# Patient Record
Sex: Female | Born: 1990 | Race: White | Hispanic: No | Marital: Single | Smoking: Never smoker
Health system: Southern US, Community
[De-identification: ages and names within clinical notes are randomized; demographics above are authoritative.]

## PROBLEM LIST (undated history)

## (undated) DIAGNOSIS — F419 Anxiety disorder, unspecified: Secondary | ICD-10-CM

---
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
## 2016-02-05 DIAGNOSIS — F418 Other specified anxiety disorders: Secondary | ICD-10-CM | POA: Insufficient documentation

## 2016-02-18 ENCOUNTER — Emergency Department (HOSPITAL_BASED_OUTPATIENT_CLINIC_OR_DEPARTMENT_OTHER)
Admission: EM | Admit: 2016-02-18 | Discharge: 2016-02-18 | Disposition: A | Discharge status: Home / Self Care | Payer: BLUE CROSS/BLUE SHIELD | Attending: Emergency Medicine | Admitting: Emergency Medicine

## 2016-02-18 ENCOUNTER — Encounter (HOSPITAL_BASED_OUTPATIENT_CLINIC_OR_DEPARTMENT_OTHER): Payer: Self-pay | Admitting: *Deleted

## 2016-02-18 DIAGNOSIS — N3001 Acute cystitis with hematuria: Secondary | ICD-10-CM | POA: Diagnosis not present

## 2016-02-18 DIAGNOSIS — R319 Hematuria, unspecified: Secondary | ICD-10-CM | POA: Diagnosis present

## 2016-02-18 DIAGNOSIS — Z79899 Other long term (current) drug therapy: Secondary | ICD-10-CM | POA: Diagnosis not present

## 2016-02-18 HISTORY — DX: Anxiety disorder, unspecified: F41.9

## 2016-02-18 LAB — URINE MICROSCOPIC-ADD ON

## 2016-02-18 LAB — URINALYSIS, ROUTINE W REFLEX MICROSCOPIC
Bilirubin Urine: NEGATIVE
GLUCOSE, UA: NEGATIVE mg/dL
KETONES UR: 15 mg/dL — AB
NITRITE: POSITIVE — AB
PH: 5.5 (ref 5.0–8.0)
Protein, ur: 30 mg/dL — AB
SPECIFIC GRAVITY, URINE: 1.004 — AB (ref 1.005–1.030)

## 2016-02-18 LAB — PREGNANCY, URINE: Preg Test, Ur: NEGATIVE

## 2016-02-18 MED ORDER — PHENAZOPYRIDINE HCL 200 MG PO TABS: 200.0000 mg | ORAL_TABLET | Freq: Three times a day (TID) | ORAL | Status: DC

## 2016-02-18 MED ORDER — CEPHALEXIN 500 MG PO CAPS: 500.0000 mg | ORAL_CAPSULE | Freq: Three times a day (TID) | ORAL | Status: AC

## 2016-02-18 NOTE — ED Provider Notes (Signed)
CSN: [id number]     Arrival date & time 02/18/16  4098 History   First MD Initiated Contact with Patient 02/18/16 [phone]     Chief Complaint  Patient presents with  . Hematuria     (Consider location/radiation/quality/duration/timing/severity/associated sxs/prior Treatment) Patient is a 25 y.o. female presenting with hematuria and dysuria.  Hematuria This is a new problem. Episode onset: 3Am. The problem occurs constantly. Associated symptoms include abdominal pain (suprapubic). Pertinent negatives include no chest pain, no headaches and no shortness of breath.  Dysuria Duration:  2 days Progression:  Unchanged Chronicity:  New Urinary symptoms: frequent urination, hematuria and hesitancy   Associated symptoms: abdominal pain (suprapubic)   Associated symptoms: no fever and no vomiting  Nausea: with urgency to urinate.     Past Medical History  Diagnosis Date  . Anxiety    Past Surgical History  Procedure Laterality Date  . Breast reduction surgery  2008   No family history on file. Social History  Substance Use Topics  . Smoking status: Never Smoker   . Smokeless tobacco: Not on file  . Alcohol Use: Not on file     Comment: social   OB History    No data available     Review of Systems  Constitutional: Negative for fever.  HENT: Negative for sore throat.   Eyes: Negative for visual disturbance.  Respiratory: Negative for cough and shortness of breath.   Cardiovascular: Negative for chest pain.  Gastrointestinal: Positive for abdominal pain (suprapubic). Negative for vomiting. Nausea: with urgency to urinate.  Genitourinary: Positive for dysuria and hematuria. Negative for difficulty urinating.  Musculoskeletal: Negative for back pain and neck pain.  Skin: Negative for rash.  Neurological: Negative for syncope and headaches. Light-headedness: up since 2am and feeling a bit lightheaded.      Allergies  Review of patient's allergies indicates no known  allergies.  Home Medications   Prior to Admission medications   Medication Sig Start Date End Date Taking? Authorizing Provider  citalopram (CELEXA) 20 MG tablet Take 20 mg by mouth daily.   Yes Historical Provider, MD  cephALEXin (KEFLEX) 500 MG capsule Take 1 capsule (500 mg total) by mouth 3 (three) times daily. 02/18/16 02/25/16  Alvira Monday, MD  phenazopyridine (PYRIDIUM) 200 MG tablet Take 1 tablet (200 mg total) by mouth 3 (three) times daily. 02/18/16   Alvira Monday, MD   BP 122/73 mmHg  Pulse 78  Temp(Src) 98.7 F (37.1 C) (Oral)  Resp 16  Ht  (1.727 m)  Wt 185 lb (83.915 kg)  BMI 28.14 kg/m2  SpO2 100% Physical Exam  Constitutional: She is oriented to person, place, and time. She appears well-developed and well-nourished. No distress.  HENT:  Head: Normocephalic and atraumatic.  Eyes: Conjunctivae and EOM are normal.  Neck: Normal range of motion.  Cardiovascular: Normal rate, regular rhythm, normal heart sounds and intact distal pulses.  Exam reveals no gallop and no friction rub.   No murmur heard. Pulmonary/Chest: Effort normal and breath sounds normal. No respiratory distress. She has no wheezes. She has no rales.  Abdominal: Soft. She exhibits no distension. There is tenderness (suprapubic). There is no guarding.  No CVA tenderness  Musculoskeletal: She exhibits no edema or tenderness.  Neurological: She is alert and oriented to person, place, and time.  Skin: Skin is warm and dry. No rash noted. She is not diaphoretic. No erythema.  Nursing note and vitals reviewed.   ED Course  Procedures (including critical care  time) Labs Review Labs Reviewed  URINALYSIS, ROUTINE W REFLEX MICROSCOPIC (NOT AT [facility]) - Abnormal; Notable for the following:    Color, Urine AMBER (*)    APPearance CLOUDY (*)    Specific Gravity, Urine 1.004 (*)    Hgb urine dipstick LARGE (*)    Ketones, ur 15 (*)    Protein, ur 30 (*)    Nitrite POSITIVE (*)    Leukocytes, UA  LARGE (*)    All other components within normal limits  URINE MICROSCOPIC-ADD ON - Abnormal; Notable for the following:    Squamous Epithelial / LPF 0-5 (*)    Bacteria, UA MANY (*)    All other components within normal limits  PREGNANCY, URINE    Imaging Review No results found. I have personally reviewed and evaluated these images and lab results as part of my medical decision-making.   EKG Interpretation None      MDM   Final diagnoses:  Acute cystitis with hematuria   25 year old female with no significant medical history presents with concern for hesitancy, urgency and frequency for 2 days, and hematuria beginning last night. Urinalysis consistent with urinary tract infection. Patient does not have any flank pain, fever or vomiting and have low suspicion for pyelonephritis. Her history is not consistent with nephrolithiasis. Given a prescription for Pyridium and Keflex for 1 week for hemorrhagic cystitis. Discussed reasons to return in detail. Patient discharged in stable condition with understanding of reasons to return.   Alvira MondayErin Aimee Heldman, MD 02/18/16 [phone]

## 2016-02-18 NOTE — ED Notes (Signed)
States awoke yesterday having urinary frequency and urgency, has been drinking a lot of water, early this am, return of S/S occurred, approx 0300hrs noted began having blood urine, and began having lower abd pain.

## 2016-07-02 DIAGNOSIS — F410 Panic disorder [episodic paroxysmal anxiety] without agoraphobia: Secondary | ICD-10-CM | POA: Insufficient documentation

## 2017-01-07 DIAGNOSIS — F5104 Psychophysiologic insomnia: Secondary | ICD-10-CM | POA: Insufficient documentation

## 2017-05-12 DIAGNOSIS — E669 Obesity, unspecified: Secondary | ICD-10-CM | POA: Insufficient documentation

## 2017-06-11 DIAGNOSIS — G43101 Migraine with aura, not intractable, with status migrainosus: Secondary | ICD-10-CM | POA: Insufficient documentation

## 2017-06-20 DIAGNOSIS — Z3042 Encounter for surveillance of injectable contraceptive: Secondary | ICD-10-CM | POA: Insufficient documentation

## 2017-10-06 ENCOUNTER — Other Ambulatory Visit: Payer: Self-pay | Admitting: Nurse Practitioner

## 2017-10-06 DIAGNOSIS — R140 Abdominal distension (gaseous): Secondary | ICD-10-CM

## 2017-10-06 DIAGNOSIS — R112 Nausea with vomiting, unspecified: Secondary | ICD-10-CM

## 2017-10-06 DIAGNOSIS — K30 Functional dyspepsia: Secondary | ICD-10-CM

## 2017-10-08 ENCOUNTER — Other Ambulatory Visit: Payer: Self-pay

## 2017-10-08 DIAGNOSIS — K582 Mixed irritable bowel syndrome: Secondary | ICD-10-CM | POA: Insufficient documentation

## 2017-10-09 ENCOUNTER — Ambulatory Visit
Admission: RE | Admit: 2017-10-09 | Discharge: 2017-10-09 | Disposition: A | Discharge status: Home / Self Care | Payer: BLUE CROSS/BLUE SHIELD | Source: Ambulatory Visit | Attending: Nurse Practitioner | Admitting: Nurse Practitioner

## 2017-10-09 DIAGNOSIS — R112 Nausea with vomiting, unspecified: Secondary | ICD-10-CM | POA: Diagnosis present

## 2017-10-09 DIAGNOSIS — K30 Functional dyspepsia: Secondary | ICD-10-CM | POA: Diagnosis present

## 2017-10-09 DIAGNOSIS — R140 Abdominal distension (gaseous): Secondary | ICD-10-CM | POA: Diagnosis present

## 2017-10-31 ENCOUNTER — Ambulatory Visit: Payer: Self-pay

## 2017-11-06 ENCOUNTER — Ambulatory Visit: Payer: Self-pay

## 2018-03-23 ENCOUNTER — Other Ambulatory Visit: Payer: Self-pay | Admitting: Nurse Practitioner

## 2018-03-23 DIAGNOSIS — R140 Abdominal distension (gaseous): Secondary | ICD-10-CM

## 2018-03-23 DIAGNOSIS — R1084 Generalized abdominal pain: Secondary | ICD-10-CM

## 2018-04-01 ENCOUNTER — Ambulatory Visit
Admission: RE | Admit: 2018-04-01 | Discharge: 2018-04-01 | Disposition: A | Discharge status: Home / Self Care | Payer: BLUE CROSS/BLUE SHIELD | Source: Ambulatory Visit | Attending: Nurse Practitioner | Admitting: Nurse Practitioner

## 2018-04-01 DIAGNOSIS — R140 Abdominal distension (gaseous): Secondary | ICD-10-CM | POA: Diagnosis present

## 2018-04-01 DIAGNOSIS — R1084 Generalized abdominal pain: Secondary | ICD-10-CM | POA: Insufficient documentation

## 2018-04-01 MED ORDER — IOHEXOL 300 MG/ML  SOLN
100.0000 mL | Freq: Once | INTRAMUSCULAR | Status: AC | PRN
  Administered 2018-04-01: 100 mL via INTRAVENOUS

## 2018-12-03 ENCOUNTER — Encounter: Payer: Self-pay | Admitting: *Deleted

## 2018-12-24 ENCOUNTER — Encounter: Payer: Self-pay | Admitting: Gastroenterology

## 2018-12-24 ENCOUNTER — Other Ambulatory Visit: Payer: Self-pay

## 2018-12-24 ENCOUNTER — Ambulatory Visit: Payer: BLUE CROSS/BLUE SHIELD | Admitting: Gastroenterology

## 2018-12-24 VITALS — BP 125/77 | HR 76 | Ht 68.0 in | Wt 207.6 lb

## 2018-12-24 DIAGNOSIS — K582 Mixed irritable bowel syndrome: Secondary | ICD-10-CM

## 2018-12-24 DIAGNOSIS — R140 Abdominal distension (gaseous): Secondary | ICD-10-CM

## 2018-12-24 MED ORDER — PANTOPRAZOLE SODIUM 40 MG PO TBEC: 40.0000 mg | DELAYED_RELEASE_TABLET | Freq: Every day | ORAL | 6 refills | Status: AC

## 2018-12-24 MED ORDER — DICYCLOMINE HCL 20 MG PO TABS: 20.0000 mg | ORAL_TABLET | Freq: Three times a day (TID) | ORAL | 3 refills | Status: DC

## 2018-12-24 NOTE — Progress Notes (Signed)
Gastroenterology Consultation  Referring Provider:     Leotis Shames, MD Primary Care Physician:  Leotis Shames, MD Primary Gastroenterologist:  Dr. Servando Snare     Reason for Consultation:     Abdominal pain bloating Constipation and diarrhea        HPI:   Kelly Miller is a 28 y.o. y/o female referred for consultation & management of Abdominal pain bloating diarrhea and constipation by Dr. Leotis Shames, MD.  This patient comes in today with a report of being very stressed.  She states that she has a very stressful life and her symptoms started about 3 years ago.  The patient reports her symptoms to include intolerance to multiple foods.  She states that she has a lot diarrhea with bloating and abdominal cramps.  She also states that the bloating sometimes makes her stomach look like she is many months pregnant.  The patient had a CT scan and ultrasound when she was seen by Dr. Earnest Conroy PA and by Dr. Mechele Collin.  The patient also reports that she had an upper endoscopy for which I do not have a report but she states that she was told she had gastritis.  She was also told that she was be started on a medication but never was.  Her main concern is her nausea with chronic abdominal pain.  The patient will have alternating diarrhea and constipation.  There is no report of any unexplained weight loss associated with these symptoms. She was very unsatisfied with the workup and does not feel like her previous gastroenterologist office was listening to her.  The patient has spoken to her mother and a was encouraged by her to have a colonoscopy.  She also states that she was recommended to have a colonoscopy by the previous GI team evaluating her. The patient states that her food intolerance includes milk products, beef, anything with garlic or onion powder.  She says that whenever these foods are in her diet she starts to vomit immediately to expel food.  She states that she is now on a strict gluten-free  the diet.  She says she is been on a gluten-free diet for some time but continues to have symptoms.  She had a previous celiac tests done that did not show her to have serology consistent with celiac sprue.  Past Medical History:  Diagnosis Date  . Anxiety       Prior to Admission medications   Medication Sig Start Date End Date Taking? Authorizing Provider  ALPRAZolam Prudy Feeler) 1 MG tablet alprazolam 1 mg tablet  Take 1 tablet 3 times a day by oral route.   Yes [provider]  medroxyPROGESTERone Acetate 150 MG/ML SUSY medroxyprogesterone 150 mg/mL intramuscular syringe  INJECT 150 MG EVERY 3 MONTHS.   Yes [provider]  omeprazole (PRILOSEC) 40 MG capsule omeprazole 40 mg capsule,delayed release  TAKE ONE CAPSULE BY MOUTH EVERY DAY 10/08/18  Yes [provider]  topiramate (TOPAMAX) 50 MG tablet Take 50 mg by mouth 2 (two) times daily. 11/23/18  Yes [provider]  venlafaxine XR (EFFEXOR-XR) 150 MG 24 hr capsule Take by mouth daily. 11/23/18  Yes [provider]  zolpidem (AMBIEN) 10 MG tablet TAKE 1/2 TABLET BY MOUTH AT BEDTIME AS NEEDED 10/23/16  Yes [provider]    History reviewed. No pertinent family history.   Social History   Tobacco Use  . Smoking status: Never Smoker  . Smokeless tobacco: Never Used  Substance Use Topics  .  Alcohol use: Yes    Comment: social  . Drug use: No    Allergies as of 12/24/2018  . (No Known Allergies)    Review of Systems:    All systems reviewed and negative except where noted in HPI.   Physical Exam:  BP 125/77   Pulse 76   Ht 5\' 8"  (1.727 m)   Wt 207 lb 9.6 oz (94.2 kg)   BMI 31.57 kg/m  No LMP recorded. Patient has had an injection. General:   Alert,  Well-developed, well-nourished, pleasant and cooperative in NAD Head:  Normocephalic and atraumatic. Eyes:  Sclera clear, no icterus.   Conjunctiva pink. Ears:  Normal auditory acuity. Nose:  No deformity, discharge, or  lesions. Mouth:  No deformity or lesions,oropharynx pink & moist. Neck:  Supple; no masses or thyromegaly. Lungs:  Respirations even and unlabored.  Clear throughout to auscultation.   No wheezes, crackles, or rhonchi. No acute distress. Heart:  Regular rate and rhythm; no murmurs, clicks, rubs, or gallops. Abdomen:  Normal bowel sounds.  No bruits.  Soft, abdominal pain mostly in the right lower quadrant and non-distended without masses, hepatosplenomegaly or hernias noted.  No guarding or rebound tenderness.  Negative Carnett sign.   Rectal:  Deferred.  Msk:  Symmetrical without gross deformities.  Good, equal movement & strength bilaterally. Pulses:  Normal pulses noted. Extremities:  No clubbing or edema.  No cyanosis. Neurologic:  Alert and oriented x3;  grossly normal neurologically. Skin:  Intact without significant lesions or rashes.  No jaundice. Lymph Nodes:  No significant cervical adenopathy. Psych:  Alert and cooperative. Normal mood and affect.  Imaging Studies: No results found.  Assessment and Plan:   Kelly Miller is a 28 y.o. 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 When questioned more about this she states that she is not bothered by Lactaid milk.  The patient has been told that she is likely lactose intolerant and that's why she is doing well with Lactaid milk and has been encouraged to continue that.  The patient also has been told that with her alternating diarrhea and constipation and bloating she likely has irritable bowel syndrome.  The patient has been tested for H. Pylori the past and it has been negative.  She will be treated as irritable bowel syndrome with alternating diarrhea and constipation and has been told of this diagnosis.  She will also be started on dicyclomine 20 mg 3 times a day.  Due to the question of whether she has celiac sprue or not she will have her blood checked for a celiac sprue panel  and DQ 2 and DQ 8.  She will also have her blood sends off for out for Alpha-gal. With her symptoms of nausea and history of gastritis she will also be started on pantoprazole 40 mg once a day and for her abdominal cramps will be started on dicyclomine 20 mg 3 times a day.  The patient has been told that these may help the symptoms but do not cure her irritable bowel syndrome.  The patient will be contacted with the results of the blood work. She has been explained the plan and states she agrees with it.  Midge Minium, MD. Clementeen Graham    Note: This dictation was prepared with Dragon dictation along with smaller phrase technology. Any transcriptional errors that result from this process are unintentional.

## 2018-12-25 ENCOUNTER — Encounter: Payer: Self-pay | Admitting: Unknown Physician Specialty

## 2019-01-07 LAB — CELIAC DISEASE HLA DQ ASSOC.
DQ2 (DQA1 0501/0505,DQB1 02XX): NEGATIVE
DQ8 (DQA1 03XX, DQB1 0302): NEGATIVE

## 2019-01-07 LAB — ALPHA-GAL PANEL
Alpha Gal IgE*: <0.1 kU/L (ref <0.10)
BEEF CLASS INTERPRETATION: 0
Class Interpretation: 0
Class Interpretation: 0
Lamb/Mutton (Ovis spp) IgE: <0.1 kU/L (ref <0.35)

## 2019-01-07 LAB — CELIAC DISEASE PANEL
Endomysial IgA: NEGATIVE
IgA/Immunoglobulin A, Serum: 118 mg/dL (ref 87–352)
Transglutaminase IgA: <2 U/mL (ref 0–3)

## 2019-01-08 ENCOUNTER — Telehealth: Payer: Self-pay

## 2019-01-08 NOTE — Telephone Encounter (Signed)
-----   Message from Midge Minium, MD sent at 01/07/2019  2:11 PM EDT ----- Let the patient know that her celiac sprue panel and alpha gal were both negative.  We are still waiting for the DQ 2 and DQ 8 to come back.

## 2019-01-08 NOTE — Telephone Encounter (Signed)
Pt notified of lab results

## 2019-01-08 NOTE — Telephone Encounter (Signed)
-----   Message from Midge Minium, MD sent at 01/07/2019  7:36 PM EDT ----- That the patient know that her celiac sprue genotype was negative for carrying any the genes that would makeher susceptible to Celexa group.  With this genotype she has less than a 0.04% chance of having celiac sprue.

## 2019-03-19 ENCOUNTER — Other Ambulatory Visit: Payer: Self-pay | Admitting: Gastroenterology

## 2019-05-12 ENCOUNTER — Other Ambulatory Visit (HOSPITAL_COMMUNITY): Payer: Self-pay | Admitting: Student

## 2019-05-12 ENCOUNTER — Other Ambulatory Visit: Payer: Self-pay | Admitting: Student

## 2019-05-12 DIAGNOSIS — R112 Nausea with vomiting, unspecified: Secondary | ICD-10-CM

## 2019-05-12 DIAGNOSIS — R140 Abdominal distension (gaseous): Secondary | ICD-10-CM

## 2019-05-12 DIAGNOSIS — R6881 Early satiety: Secondary | ICD-10-CM

## 2019-05-12 DIAGNOSIS — R630 Anorexia: Secondary | ICD-10-CM

## 2019-05-17 ENCOUNTER — Ambulatory Visit: Payer: BLUE CROSS/BLUE SHIELD

## 2019-05-21 ENCOUNTER — Ambulatory Visit
Admission: RE | Admit: 2019-05-21 | Discharge: 2019-05-21 | Disposition: A | Discharge status: Home / Self Care | Payer: BC Managed Care – PPO | Source: Ambulatory Visit | Attending: Student | Admitting: Student

## 2019-05-21 ENCOUNTER — Other Ambulatory Visit
Admission: RE | Admit: 2019-05-21 | Discharge: 2019-05-21 | Disposition: A | Discharge status: Home / Self Care | Payer: BC Managed Care – PPO | Source: Ambulatory Visit | Attending: Student | Admitting: Student

## 2019-05-21 ENCOUNTER — Other Ambulatory Visit: Payer: Self-pay

## 2019-05-21 DIAGNOSIS — R140 Abdominal distension (gaseous): Secondary | ICD-10-CM

## 2019-05-21 DIAGNOSIS — R112 Nausea with vomiting, unspecified: Secondary | ICD-10-CM | POA: Diagnosis present

## 2019-05-21 DIAGNOSIS — R630 Anorexia: Secondary | ICD-10-CM | POA: Insufficient documentation

## 2019-05-21 DIAGNOSIS — R6881 Early satiety: Secondary | ICD-10-CM | POA: Insufficient documentation

## 2019-05-21 LAB — PREGNANCY, URINE: Preg Test, Ur: NEGATIVE

## 2019-05-28 ENCOUNTER — Telehealth: Payer: Self-pay | Admitting: Gastroenterology

## 2019-05-28 NOTE — Telephone Encounter (Signed)
Received referral from [facility] requesting for [place] to schedule Esophageal Manometry. Patient tells [place] Rob Hickman is not doing these because of COVID. The referral that was faxed placed in Dr. Woodward Ku office for review.

## 2019-06-01 ENCOUNTER — Other Ambulatory Visit: Payer: Self-pay

## 2019-06-01 DIAGNOSIS — R6881 Early satiety: Secondary | ICD-10-CM

## 2019-06-01 DIAGNOSIS — R112 Nausea with vomiting, unspecified: Secondary | ICD-10-CM

## 2019-06-01 DIAGNOSIS — Z1159 Encounter for screening for other viral diseases: Secondary | ICD-10-CM

## 2019-06-01 NOTE — Telephone Encounter (Signed)
Message to [facility] to discuss adding this to the intended testing.

## 2019-06-01 NOTE — Telephone Encounter (Signed)
Ok to schedule direct esophageal manometry and 24hr pH study with impedance off PPI for 7 days. Thanks

## 2019-06-01 NOTE — Telephone Encounter (Signed)
Kelly Klippel PA agrees to the recommendations given by Dr Silverio Decamp. Patient notified she will be getting esophageal manometry with 24 hour pH impedance.

## 2019-06-09 ENCOUNTER — Telehealth: Payer: Self-pay | Admitting: Gastroenterology

## 2019-06-09 NOTE — Telephone Encounter (Signed)
Her primary GI has changed her medications. She would like to see if this helps before she does the esophageal manometry.

## 2019-06-09 NOTE — Telephone Encounter (Signed)
Pt would like to reschedule hospital procedure.

## 2019-06-10 ENCOUNTER — Other Ambulatory Visit: Admission: RE | Admit: 2019-06-10 | Payer: BC Managed Care – PPO | Source: Ambulatory Visit

## 2019-06-16 ENCOUNTER — Other Ambulatory Visit: Payer: Self-pay

## 2019-06-16 DIAGNOSIS — Z20822 Contact with and (suspected) exposure to covid-19: Secondary | ICD-10-CM

## 2019-06-17 LAB — NOVEL CORONAVIRUS, NAA: SARS-CoV-2, NAA: NOT DETECTED

## 2019-07-22 ENCOUNTER — Other Ambulatory Visit: Admission: RE | Admit: 2019-07-22 | Payer: BC Managed Care – PPO | Source: Ambulatory Visit

## 2019-07-26 ENCOUNTER — Ambulatory Visit (HOSPITAL_COMMUNITY)
Admission: RE | Admit: 2019-07-26 | Payer: BC Managed Care – PPO | Source: Home / Self Care | Admitting: Gastroenterology

## 2019-07-26 ENCOUNTER — Encounter (HOSPITAL_COMMUNITY): Admission: RE | Payer: Self-pay | Source: Home / Self Care

## 2019-07-26 SURGERY — MANOMETRY, ESOPHAGUS

## 2019-08-30 ENCOUNTER — Telehealth: Payer: Self-pay | Admitting: Obstetrics and Gynecology

## 2019-08-30 NOTE — Telephone Encounter (Signed)
Calling to schedule patient appointment to establish care we have received reocrd from [facility]. Unable to reach patient phone number on file is disconnected or longer in use.

## 2019-08-31 NOTE — Telephone Encounter (Signed)
Called and spoke with patient to schedule appointment. Patient wishes to call back to be schedule 

## 2019-09-02 NOTE — Telephone Encounter (Signed)
Phone number in file is not accepting calls at this time unable to leave message for patient to call back to be schedule

## 2019-10-04 ENCOUNTER — Other Ambulatory Visit: Payer: Self-pay | Admitting: Neurology

## 2019-10-04 DIAGNOSIS — R519 Headache, unspecified: Secondary | ICD-10-CM

## 2019-10-12 ENCOUNTER — Ambulatory Visit: Payer: BC Managed Care – PPO

## 2019-12-16 ENCOUNTER — Ambulatory Visit: Payer: BC Managed Care – PPO

## 2020-01-08 ENCOUNTER — Ambulatory Visit: Payer: BC Managed Care – PPO | Attending: Internal Medicine

## 2020-01-08 DIAGNOSIS — Z23 Encounter for immunization: Secondary | ICD-10-CM

## 2020-01-08 NOTE — Progress Notes (Signed)
   Covid-19 Vaccination Clinic  Name:  Kelly Miller    MRN: [id number] DOB: 1991-06-02  01/08/2020  [name] was observed post Covid-19 immunization for 15 minutes without incident. She was provided with Vaccine Information Sheet and instruction to access the V-Safe system.   [name] was instructed to call 911 with any severe reactions post vaccine: Marland Kitchen Difficulty breathing  . Swelling of face and throat  . A fast heartbeat  . A bad rash all over body  . Dizziness and weakness   Immunizations Administered    Name Date Dose VIS Date Route   Pfizer COVID-19 Vaccine 01/08/2020 10:57 AM 0.3 mL 10/01/2019 Intramuscular   Manufacturer: [employer], [employer]   Lot: [id number]   NDC: [address]-2

## 2020-01-29 ENCOUNTER — Ambulatory Visit: Payer: BC Managed Care – PPO | Attending: Internal Medicine

## 2020-01-29 DIAGNOSIS — Z23 Encounter for immunization: Secondary | ICD-10-CM

## 2020-01-29 NOTE — Progress Notes (Signed)
   Covid-19 Vaccination Clinic  Name:  Kelly Miller    MRN: [id number] DOB: 06-Apr-1991  01/29/2020  [name] was observed post Covid-19 immunization for 30 minutes based on pre-vaccination screening   without incident. She was provided with Vaccine Information Sheet and instruction to access the V-Safe system.   [name] was instructed to call 911 with any severe reactions post vaccine: Marland Kitchen Difficulty breathing  . Swelling of face and throat  . A fast heartbeat  . A bad rash all over body  . Dizziness and weakness   Immunizations Administered    Name Date Dose VIS Date Route   Pfizer COVID-19 Vaccine 01/29/2020 10:32 AM 0.3 mL 10/01/2019 Intramuscular   Manufacturer: [employer], [employer]   Lot: [id number]   NDC: [address]-7

## 2020-02-07 IMAGING — RF UPPER GI W/ SMALL BOWEL
12 of 19 series · 14 of 24 positions shown · non-contrast
Comparison: 04/01/2018

CLINICAL DATA: Pt reports food getting hung up in her mid-sternal
area. Pt states that she issues with solids and liquids that has
increasingly gotten worse within the next 6 months. Pt says that
most of the time she throws up after eating anything and has
diarrhea.

EXAM:
UPPER GI SERIES WITH SMALL BOWEL FOLLOW-THROUGH
FLUOROSCOPY TIME:  Fluoroscopy Time:  54 seconds
Radiation Exposure Index (if provided by the fluoroscopic device):
38.8 mGy
Number of Acquired Spot Images: 0
TECHNIQUE: Combined double contrast and single contrast upper GI series using
effervescent crystals, thick barium, and thin barium. Subsequently,
serial images of the small bowel were obtained including spot views
of the terminal ileum.

[Series 1: t abdomen supine · 0.15mm/px · 1 of 1 slices shown]
[im 1/1]
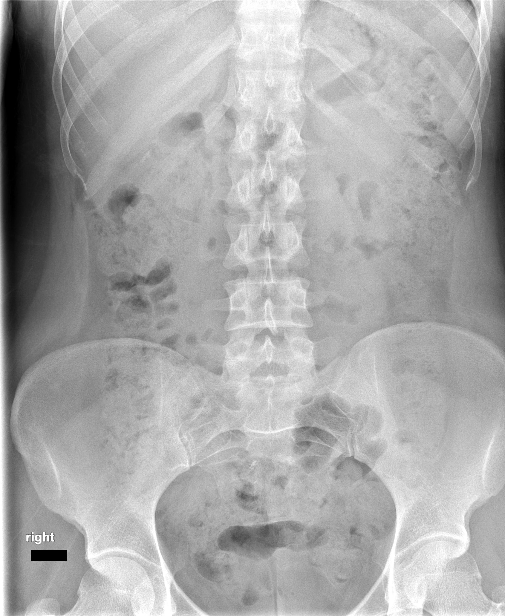

[Series 3: cp_standard · 0.26mm/px · 2 of 12 frames shown (1 of 11)]
[frame 2/12]
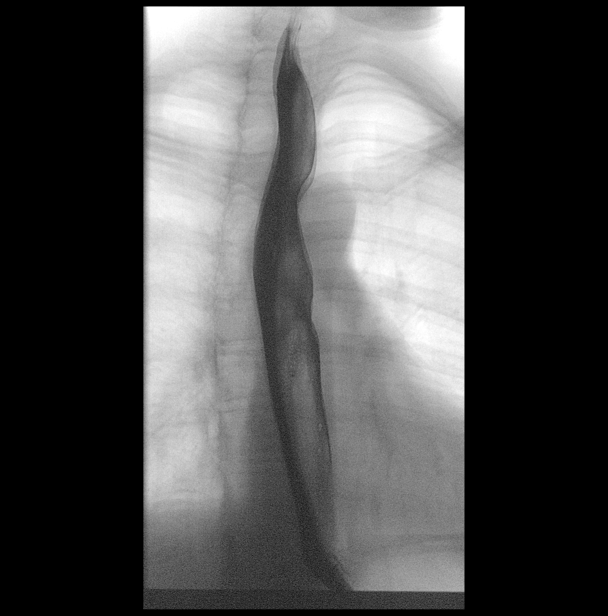
[frame 11/12]
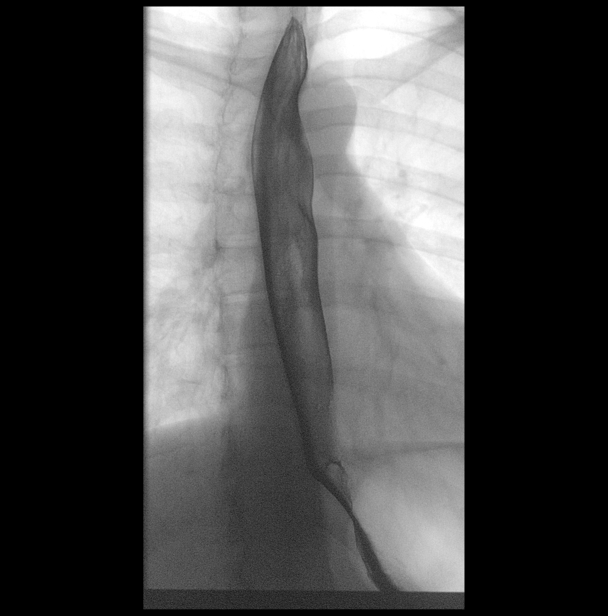

[Series 6: cp_standard · 0.28mm/px · 1 of 1 slices shown (2 of 11)]
[im 1/1]
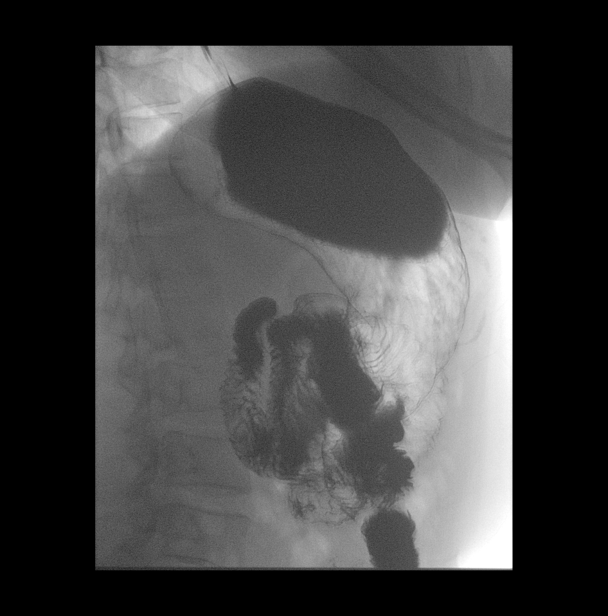

[Series 7: cp_standard · 0.28mm/px · 1 of 1 slices shown (3 of 11)]
[im 1/1]
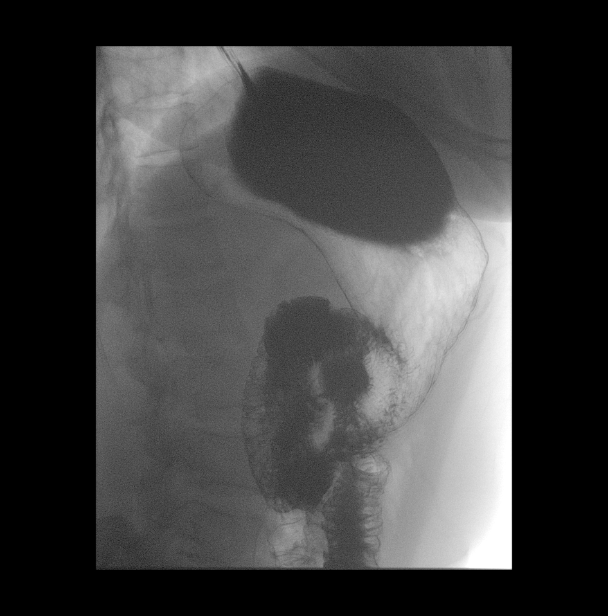

[Series 9: cp_standard · 0.28mm/px · 1 of 1 slices shown (4 of 11)]
[im 1/1]
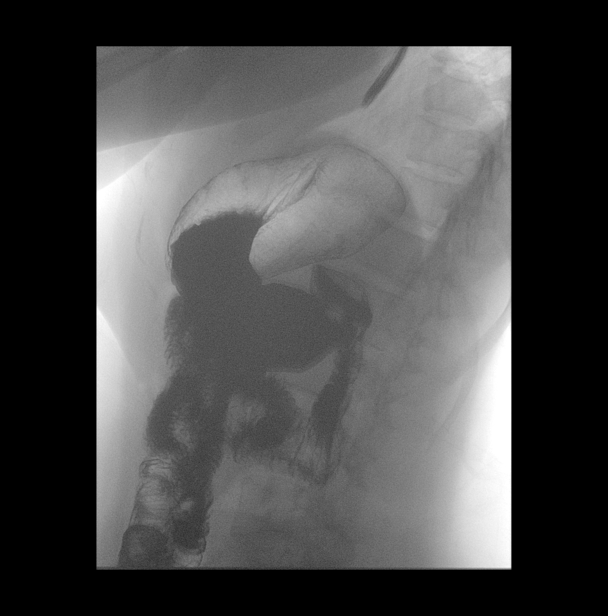

[Series 11: cp_standard · 0.28mm/px · 1 of 1 slices shown (5 of 11)]
[im 1/1]
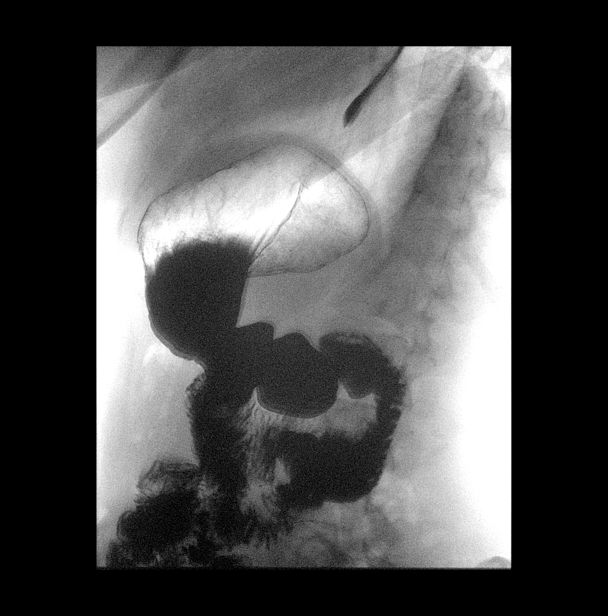

[Series 12: cp_standard · 0.29mm/px · 1 of 1 slices shown (6 of 11)]
[im 1/1]
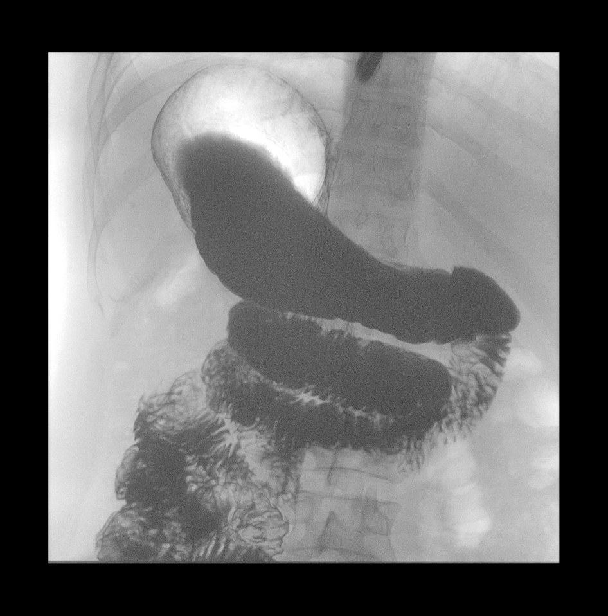

[Series 14: cp_standard · 0.29mm/px · 1 of 1 slices shown (7 of 11)]
[im 1/1]
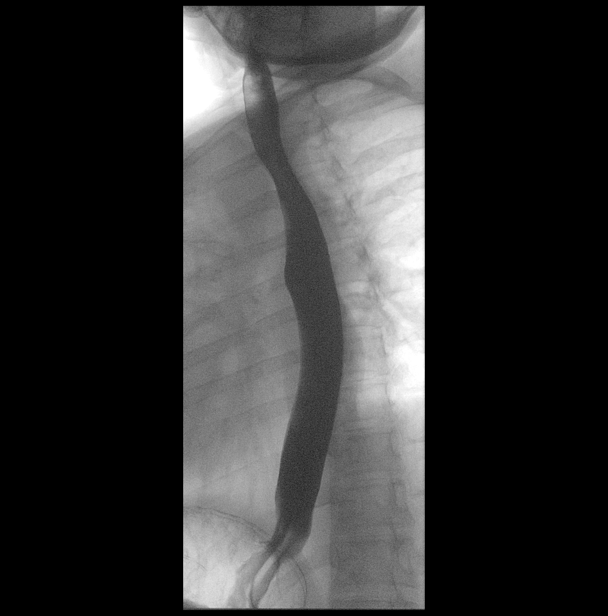

[Series 16: cp_standard · 0.26mm/px · 1 of 1 slices shown (8 of 11)]
[im 1/1]
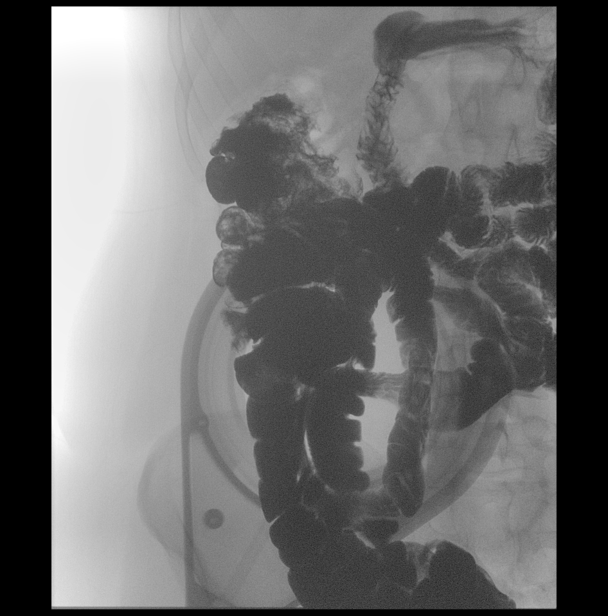

[Series 19: cp_standard · 0.26mm/px · 1 of 1 slices shown (9 of 11)]
[im 1/1]
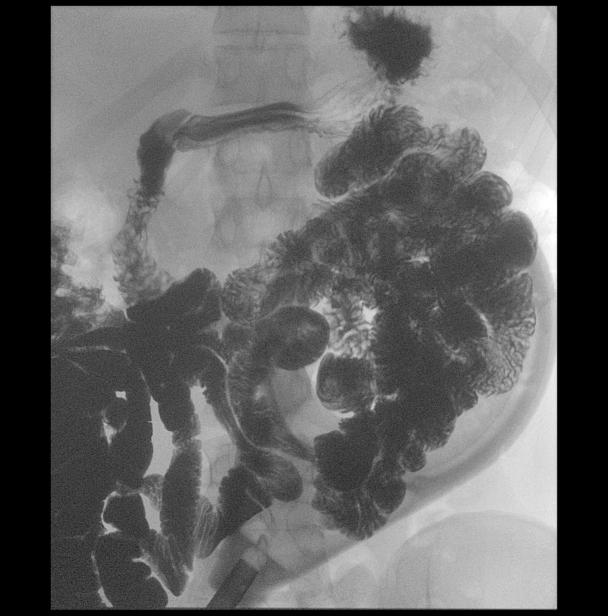

[Series 20: cp_standard · 0.26mm/px · 2 of 111 frames shown (10 of 11)]
[frame 17/111]
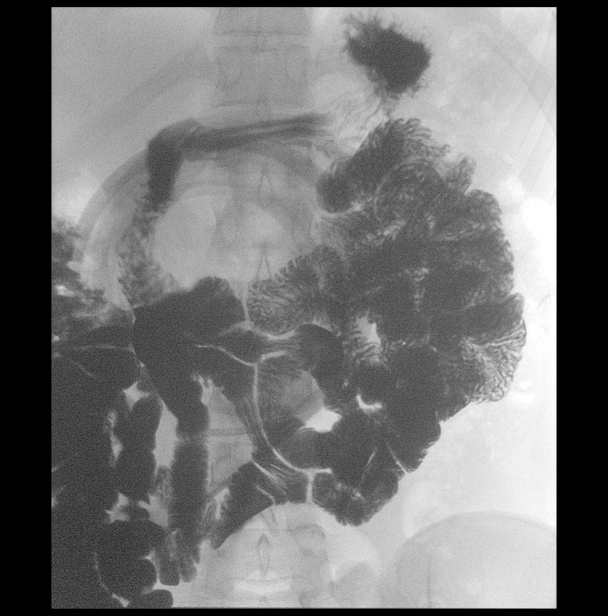
[frame 56/111]
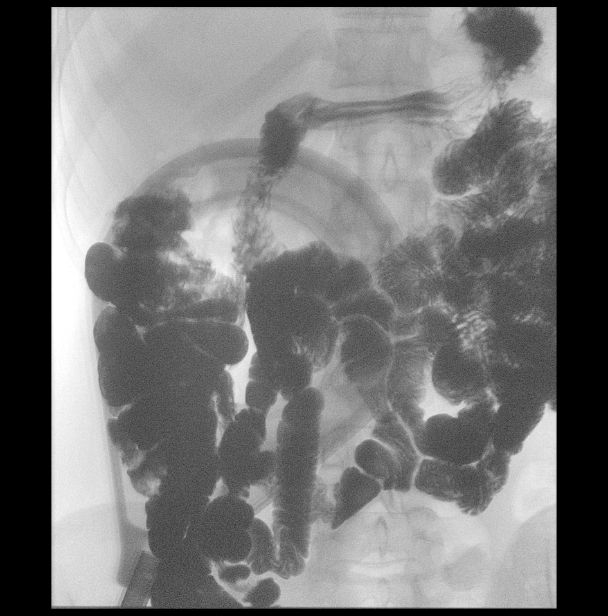

[Series 21: cp_standard · 0.26mm/px · 1 of 1 slices shown (11 of 11)]
[im 1/1]
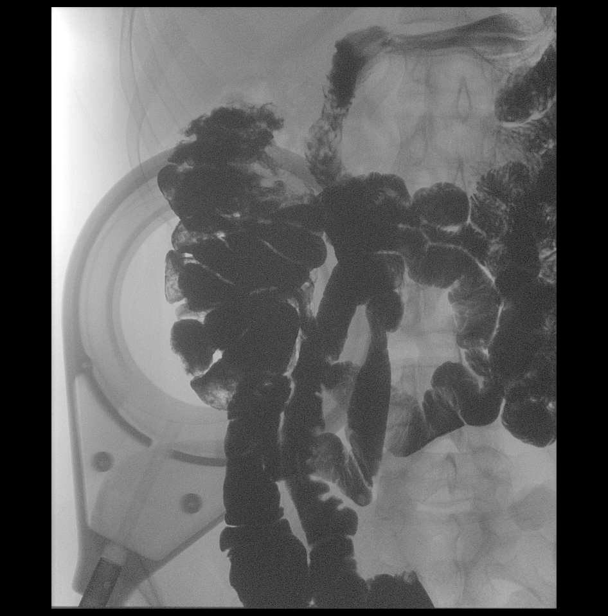

[14 of 24 positions shown; findings below may reference images not displayed]

FINDINGS: KUB:

There is no bowel dilatation to suggest obstruction. There is no
evidence of pneumoperitoneum, portal venous gas or pneumatosis.
There is a large amount of stool throughout the colon.

There are no pathologic calcifications along the expected course of
the ureters.

The osseous structures are unremarkable.

UPPER GI SERIES AND SMALL-BOWEL FOLLOW-THROUGH:

Examination of the esophagus demonstrated normal esophageal
motility. Normal esophageal morphology without evidence of
esophagitis or ulceration. No esophageal stricture, diverticula, or
mass lesion. No evidence of hiatal hernia. Mild gastroesophageal
reflux.

Examination of the stomach demonstrated normal rugal folds and areae
gastricae. The gastric mucosa appeared unremarkable without evidence
of ulceration, scarring, or mass lesion. Gastric motility and
emptying was normal. Fluoroscopic examination of the duodenum
demonstrates normal motility and morphology without evidence of
ulceration or mass lesion.

Medium density barium was periodically observed under fluoroscopy to
travel from the stomach to the ascending colon (over a 15 minute
time period). There is no evidence of small bowel stricture or
obstruction. No large filling defects to suggest mass lesion. In
addition, there is no evidence of tethering or definite inflammatory
changes present within the small bowel.
IMPRESSION: 1. Mild gastroesophageal reflux.
2. Otherwise normal upper GI series.
3. No focal abnormality of the small bowel. Relatively rapid transit
of contrast through the small bowel over a span of 15 minutes.

## 2020-03-10 ENCOUNTER — Other Ambulatory Visit: Payer: Self-pay

## 2020-03-10 ENCOUNTER — Ambulatory Visit (LOCAL_COMMUNITY_HEALTH_CENTER): Payer: Self-pay

## 2020-03-10 DIAGNOSIS — Z23 Encounter for immunization: Secondary | ICD-10-CM
